# Patient Record
Sex: Male | Born: 1980 | Race: White | Hispanic: No | Marital: Single
Health system: Midwestern US, Community
[De-identification: ages and names within clinical notes are randomized; demographics above are authoritative.]

## PROBLEM LIST (undated history)

## (undated) HISTORY — PX: NOSE SURGERY: SHX723

## (undated) HISTORY — PX: FRACTURE SURGERY: SHX138

---

## 2020-05-08 ENCOUNTER — Ambulatory Visit (INDEPENDENT_AMBULATORY_CARE_PROVIDER_SITE_OTHER): Payer: 59

## 2020-05-08 ENCOUNTER — Encounter: Payer: Self-pay | Admitting: Emergency Medicine

## 2020-05-08 ENCOUNTER — Other Ambulatory Visit: Payer: Self-pay

## 2020-05-08 ENCOUNTER — Ambulatory Visit
Admission: EM | Admit: 2020-05-08 | Discharge: 2020-05-08 | Disposition: A | Discharge status: Home / Self Care | Payer: 59 | Attending: Family Medicine | Admitting: Family Medicine

## 2020-05-08 DIAGNOSIS — M5136 Other intervertebral disc degeneration, lumbar region: Secondary | ICD-10-CM

## 2020-05-08 DIAGNOSIS — W19XXXA Unspecified fall, initial encounter: Secondary | ICD-10-CM | POA: Diagnosis not present

## 2020-05-08 DIAGNOSIS — M545 Low back pain, unspecified: Secondary | ICD-10-CM

## 2020-05-08 DIAGNOSIS — M25531 Pain in right wrist: Secondary | ICD-10-CM

## 2020-05-08 NOTE — Discharge Instructions (Addendum)
Your x-rays were negative for any dislocations or fractures  Lumbar x-ray did still show moderate multilevel degenerative disc disease  You may wear wrist brace for comfort, you may also take ibuprofen or Tylenol as needed for pain  If things are not getting better follow-up with orthopedics as needed.

## 2020-05-08 NOTE — ED Provider Notes (Signed)
[facility] CARE CENTER   [id number] 05/08/20 Arrival Time: 8185  UD:JSHFW PAIN  SUBJECTIVE: History from: patient. Rodney Holt is a 39 y.o. male complains of right wrist pain, low back pain.  Reports that he tripped on the steps coming out of his apartment to go to work this morning at 4 AM.  Symptoms are made worse with activity and range of motion exercises.  Reports that he is not ever had these symptoms before.  Has not taken any OTC medications for this.  Denies fever, chills, erythema, ecchymosis, effusion, weakness, numbness and tingling, saddle paresthesias, loss of bowel or bladder function.      ROS: As per HPI.  All other pertinent ROS negative.     History reviewed. No pertinent past medical history. Past Surgical History:  Procedure Laterality Date  . FRACTURE SURGERY    . NOSE SURGERY     No Known Allergies No current facility-administered medications on file prior to encounter.   No current outpatient medications on file prior to encounter.   Social History   Socioeconomic History  . Marital status: Single    Spouse name: Not on file  . Number of children: Not on file  . Years of education: Not on file  . Highest education level: Not on file  Occupational History  . Not on file  Tobacco Use  . Smoking status: Never Smoker  . Smokeless tobacco: Never Used  Vaping Use  . Vaping Use: Never used  Substance and Sexual Activity  . Alcohol use: Yes  . Drug use: Never  . Sexual activity: Not on file  Other Topics Concern  . Not on file  Social History Narrative  . Not on file   Social Determinants of Health   Financial Resource Strain:   . Difficulty of Paying Living Expenses:   Food Insecurity:   . Worried About Programme researcher, broadcasting/film/video in the Last Year:   . Barista in the Last Year:   Transportation Needs:   . Freight forwarder (Medical):   Marland Kitchen Lack of Transportation (Non-Medical):   Physical Activity:   . Days of Exercise per Week:   . Minutes  of Exercise per Session:   Stress:   . Feeling of Stress :   Social Connections:   . Frequency of Communication with Friends and Family:   . Frequency of Social Gatherings with Friends and Family:   . Attends Religious Services:   . Active Member of Clubs or Organizations:   . Attends Banker Meetings:   Marland Kitchen Marital Status:   Intimate Partner Violence:   . Fear of Current or Ex-Partner:   . Emotionally Abused:   Marland Kitchen Physically Abused:   . Sexually Abused:    Family History  Problem Relation Age of Onset  . Healthy Mother   . Cancer Father     OBJECTIVE:  Vitals:   05/08/20 0913 05/08/20 0916  BP:  130/88  Pulse:  76  Resp:  16  Temp:  98 F (36.7 C)  TempSrc:  Oral  SpO2:  100%  Weight: 256 lb (116.1 kg)   Height: 5\' 7"  (1.702 m)     General appearance: ALERT; in no acute distress.  Head: NCAT Lungs: Normal respiratory effort CV: Radial and pedal pulses 2+ bilaterally. Cap refill < 2 seconds Musculoskeletal:  Inspection: Skin warm, dry, clear and intact without obvious erythema, effusion, or ecchymosis.  Palpation: Ulnar styloid to right wrist tender to palpation, midline low  back tenderness on exam ROM: Limited ROM active and passive to right wrist Skin: warm and dry Neurologic: Ambulates without difficulty; Sensation intact about the upper/ lower extremities Psychological: alert and cooperative; normal mood and affect  DIAGNOSTIC STUDIES:  DG Lumbar Spine Complete  Result Date: 05/08/2020 CLINICAL DATA:  Low back pain after fall today. EXAM: LUMBAR SPINE - COMPLETE 4+ VIEW COMPARISON:  None. FINDINGS: No fracture or spondylolisthesis is noted. Moderate degenerative disc disease is noted at L4-5 and L5-S1. IMPRESSION: Moderate multilevel degenerative disc disease. No acute abnormality seen in the lumbar spine. Electronically Signed   By: Lupita Raider M.D.   On: 05/08/2020 10:25   DG Wrist Complete Right  Result Date: 05/08/2020 CLINICAL DATA:   39 year old male status post fall down stairs with ulnar styloid pain. EXAM: RIGHT WRIST - COMPLETE 3+ VIEW COMPARISON:  None. FINDINGS: Bone mineralization is within normal limits. The distal ulna appears intact. There is no evidence of fracture or dislocation. There is no evidence of arthropathy or other focal bone abnormality. No discrete soft tissue injury. IMPRESSION: Negative. Electronically Signed   By: Odessa Fleming M.D.   On: 05/08/2020 10:25     ASSESSMENT & PLAN:  1. Acute midline low back pain without sciatica   2. Right wrist pain   3. Fall, initial encounter   4. Degenerative disc disease, lumbar     Continue conservative management of rest, ice, and gentle stretches Take ibuprofen or tylenol as needed Follow up with PCP if symptoms persist Return or go to the ER if you have any new or worsening symptoms (fever, chills, chest pain, abdominal pain, changes in bowel or bladder habits, pain radiating into lower legs)   Reviewed expectations re: course of current medical issues. Questions answered. Outlined signs and symptoms indicating need for more acute intervention. Patient verbalized understanding. After Visit Summary given.       Moshe Cipro, NP 05/08/20 1045

## 2020-05-08 NOTE — ED Triage Notes (Signed)
Patient states that he was leaving his apartment this morning and slipped on the steps and fell on his buttocks and lower back.  Patient c/o right wrist, lower back and tailbone pain.

## 2020-05-23 ENCOUNTER — Emergency Department
Admission: EM | Admit: 2020-05-23 | Discharge: 2020-05-23 | Disposition: A | Discharge status: Home / Self Care | Payer: 59 | Attending: Emergency Medicine | Admitting: Emergency Medicine

## 2020-05-23 ENCOUNTER — Other Ambulatory Visit: Payer: Self-pay

## 2020-05-23 ENCOUNTER — Encounter: Payer: Self-pay | Admitting: Emergency Medicine

## 2020-05-23 DIAGNOSIS — M5431 Sciatica, right side: Secondary | ICD-10-CM

## 2020-05-23 DIAGNOSIS — Y929 Unspecified place or not applicable: Secondary | ICD-10-CM | POA: Insufficient documentation

## 2020-05-23 DIAGNOSIS — Y999 Unspecified external cause status: Secondary | ICD-10-CM | POA: Diagnosis not present

## 2020-05-23 DIAGNOSIS — W19XXXA Unspecified fall, initial encounter: Secondary | ICD-10-CM | POA: Insufficient documentation

## 2020-05-23 DIAGNOSIS — Y939 Activity, unspecified: Secondary | ICD-10-CM | POA: Diagnosis not present

## 2020-05-23 DIAGNOSIS — M549 Dorsalgia, unspecified: Secondary | ICD-10-CM | POA: Diagnosis present

## 2020-05-23 MED ORDER — METHYLPREDNISOLONE 4 MG PO TBPK
ORAL_TABLET | ORAL | 0 refills | Status: DC
Start: 2020-05-23 — End: 2021-05-08

## 2020-05-23 MED ORDER — CYCLOBENZAPRINE HCL 10 MG PO TABS: 10.0000 mg | ORAL_TABLET | Freq: Three times a day (TID) | ORAL | 0 refills | Status: DC | PRN

## 2020-05-23 MED ORDER — METHYLPREDNISOLONE SODIUM SUCC 125 MG IJ SOLR
125.0000 mg | Freq: Once | INTRAMUSCULAR | Status: AC
  Administered 2020-05-23: 125 mg via INTRAMUSCULAR
  Filled 2020-05-23: qty 2

## 2020-05-23 MED ORDER — CYCLOBENZAPRINE HCL 10 MG PO TABS
10.0000 mg | ORAL_TABLET | Freq: Once | ORAL | Status: AC
  Administered 2020-05-23: 10 mg via ORAL
  Filled 2020-05-23: qty 1

## 2020-05-23 MED ORDER — TRAMADOL HCL 50 MG PO TABS: 50.0000 mg | ORAL_TABLET | Freq: Two times a day (BID) | ORAL | 0 refills | Status: DC | PRN

## 2020-05-23 NOTE — ED Notes (Signed)
See triage note  Presents s/p fall  States he fell about 1 week ago  Missed the last step  Is having pain to right lower back and into right leg  Ambulates well

## 2020-05-23 NOTE — ED Provider Notes (Signed)
[facility] Emergency Department Provider Note   ____________________________________________   First MD Initiated Contact with Patient 05/23/20 [phone]     (approximate)  I have reviewed the triage vital signs and the nursing notes.   HISTORY  Chief Complaint Back Pain    HPI Rodney Holt is a 39 y.o. male patient presents with radicular back pain to the right lower extremity.  Patient denies bladder or bowel dysfunction.  Patient state complaint started last week.  Patient states fell 2 weeks ago and has had intermittent back pain but the radicular component is new.         History reviewed. No pertinent past medical history.  There are no problems to display for this patient.   Past Surgical History:  Procedure Laterality Date  . FRACTURE SURGERY    . NOSE SURGERY      Prior to Admission medications   Medication Sig Start Date End Date Taking? Authorizing Provider  cyclobenzaprine (FLEXERIL) 10 MG tablet Take 1 tablet (10 mg total) by mouth 3 (three) times daily as needed. 05/23/20   Joni Reining, PA-C  methylPREDNISolone (MEDROL DOSEPAK) 4 MG TBPK tablet Take Tapered dose as directed starting tomorrow morning 05/23/20   Joni Reining, PA-C  traMADol (ULTRAM) 50 MG tablet Take 1 tablet (50 mg total) by mouth every 12 (twelve) hours as needed. 05/23/20   Joni Reining, PA-C    Allergies Patient has no known allergies.  Family History  Problem Relation Age of Onset  . Healthy Mother   . Cancer Father     Social History Social History   Tobacco Use  . Smoking status: Never Smoker  . Smokeless tobacco: Never Used  Vaping Use  . Vaping Use: Never used  Substance Use Topics  . Alcohol use: Yes  . Drug use: Never    Review of Systems Constitutional: No fever/chills Eyes: No visual changes. ENT: No sore throat. Cardiovascular: Denies chest pain. Respiratory: Denies shortness of breath. Gastrointestinal: No abdominal pain.  No  nausea, no vomiting.  No diarrhea.  No constipation. Genitourinary: Negative for dysuria. Musculoskeletal: Positive for back pain. Skin: Negative for rash. Neurological: Intermitting numbness sensation to the right lower extremity.   ____________________________________________   PHYSICAL EXAM:  VITAL SIGNS: ED Triage Vitals  Enc Vitals Group     BP 05/23/20 0332 (!) 182/93     Pulse Rate 05/23/20 0332 95     Resp 05/23/20 0332 18     Temp 05/23/20 0332 98.6 F (37 C)     Temp Source 05/23/20 0332 Oral     SpO2 05/23/20 0332 98 %     Weight 05/23/20 0715 255 lb 15.3 oz (116.1 kg)     Height 05/23/20 0715 5\' 7"  (1.702 m)     Head Circumference --      Peak Flow --      Pain Score --      Pain Loc --      Pain Edu? --      Excl. in GC? --    Constitutional: Alert and oriented. Well appearing and in no acute distress.  BMI is 40.09. Cardiovascular: Normal rate, regular rhythm. Grossly normal heart sounds.  Good peripheral circulation. Respiratory: Normal respiratory effort.  No retractions. Lungs CTAB. Musculoskeletal: No obvious spinal deformity.  Patient has moderate guarding palpation L4-S1.  No lower extremity tenderness nor edema.   Neurologic:  Normal speech and language. No gross focal neurologic deficits are appreciated. No  gait instability.  Patient had negative straight leg test in the supine position. Skin:  Skin is warm, dry and intact. No rash noted. Psychiatric: Mood and affect are normal. Speech and behavior are normal.  ____________________________________________   LABS (all labs ordered are listed, but only abnormal results are displayed)  Labs Reviewed - No data to display ____________________________________________  EKG   ____________________________________________  RADIOLOGY  ED MD interpretation:    Official radiology report(s): No results found.  ____________________________________________   PROCEDURES  Procedure(s) performed  (including Critical Care):  Procedures   ____________________________________________   INITIAL IMPRESSION / ASSESSMENT AND PLAN / ED COURSE  As part of my medical decision making, I reviewed the following data within the electronic MEDICAL RECORD NUMBER     Patient presents for radicular complaints to the right lower extremity status post fall 2 weeks ago.  Patient complaint physical exam consistent with sciatica.  Patient given discharge care instruction advised take medication as directed.  Patient advised [facility] care with the [facility].    Rodney Holt was evaluated in Emergency Department on 05/23/2020 for the symptoms described in the history of present illness. He was evaluated in the context of the global COVID-19 pandemic, which necessitated consideration that the patient might be at risk for infection with the SARS-CoV-2 virus that causes COVID-19. Institutional protocols and algorithms that pertain to the evaluation of patients at risk for COVID-19 are in a state of rapid change based on information released by regulatory bodies including the CDC and federal and state organizations. These policies and algorithms were followed during the patient's care in the ED.       ____________________________________________   FINAL CLINICAL IMPRESSION(S) / ED DIAGNOSES  Final diagnoses:  Sciatica of right side     ED Discharge Orders         Ordered    methylPREDNISolone (MEDROL DOSEPAK) 4 MG TBPK tablet     Discontinue  Reprint     05/23/20 0733    cyclobenzaprine (FLEXERIL) 10 MG tablet  3 times daily PRN     Discontinue  Reprint     05/23/20 0733    traMADol (ULTRAM) 50 MG tablet  Every 12 hours PRN     Discontinue  Reprint     05/23/20 7253           Note:  This document was prepared using Dragon voice recognition software and may include unintentional dictation errors.    Joni Reining, PA-C 05/23/20 6644    Minna Antis, MD 05/23/20 1436

## 2020-05-23 NOTE — Discharge Instructions (Signed)
Follow discharge care instruction take medication as directed. °

## 2020-05-23 NOTE — ED Triage Notes (Signed)
Pt reports falling x1 week ago and since has had pain in the lower right back that radiates down into the right leg.

## 2020-05-26 ENCOUNTER — Telehealth: Payer: Self-pay | Admitting: General Practice

## 2020-05-26 NOTE — Telephone Encounter (Signed)
Individual has health insurance and is ineligible to become a pt in the clinic. °

## 2020-11-19 ENCOUNTER — Other Ambulatory Visit: Payer: Self-pay

## 2020-11-19 ENCOUNTER — Emergency Department
Admission: EM | Admit: 2020-11-19 | Discharge: 2020-11-19 | Disposition: A | Discharge status: Home / Self Care | Payer: 59 | Attending: Emergency Medicine | Admitting: Emergency Medicine

## 2020-11-19 DIAGNOSIS — Z5321 Procedure and treatment not carried out due to patient leaving prior to being seen by health care provider: Secondary | ICD-10-CM | POA: Diagnosis not present

## 2020-11-19 DIAGNOSIS — R079 Chest pain, unspecified: Secondary | ICD-10-CM | POA: Insufficient documentation

## 2020-11-19 DIAGNOSIS — R6884 Jaw pain: Secondary | ICD-10-CM | POA: Diagnosis not present

## 2020-11-19 LAB — CBC
HCT: 47.6 % (ref 39.0–52.0)
Hemoglobin: 16.4 g/dL (ref 13.0–17.0)
MCH: 29.4 pg (ref 26.0–34.0)
MCHC: 34.5 g/dL (ref 30.0–36.0)
MCV: 85.5 fL (ref 80.0–100.0)
Platelets: 242 10*3/uL (ref 150–400)
RBC: 5.57 MIL/uL (ref 4.22–5.81)
RDW: 12.7 % (ref 11.5–15.5)
WBC: 8.7 10*3/uL (ref 4.0–10.5)
nRBC: 0 % (ref 0.0–0.2)

## 2020-11-19 LAB — BASIC METABOLIC PANEL
Anion gap: 11 (ref 5–15)
BUN: 14 mg/dL (ref 6–20)
CO2: 24 mmol/L (ref 22–32)
Calcium: 8.8 mg/dL — ABNORMAL LOW (ref 8.9–10.3)
Chloride: 106 mmol/L (ref 98–111)
Creatinine, Ser: 0.91 mg/dL (ref 0.61–1.24)
GFR, Estimated: >60 mL/min (ref >60)
Glucose, Bld: 94 mg/dL (ref 70–99)
Potassium: 4.2 mmol/L (ref 3.5–5.1)
Sodium: 141 mmol/L (ref 135–145)

## 2020-11-19 LAB — TROPONIN I (HIGH SENSITIVITY): Troponin I (High Sensitivity): <2 ng/L (ref <18)

## 2020-11-19 NOTE — ED Triage Notes (Signed)
Pt arrives via pov, ambulatory to triage. Pt c/o CP starting yesterday, radiating to left jaw. Pt denies any n/v/d, SOB. NAD noted at this time

## 2020-11-19 NOTE — ED Notes (Signed)
Pt called to be roomed x2.  No answer.

## 2020-11-19 NOTE — ED Notes (Signed)
Pt LWBS after triage.  Pt did not inform [employer] Char[employer] fundraiser.

## 2020-11-19 NOTE — ED Notes (Signed)
Pt called to be roomed x1.  No answer by patient.

## 2020-11-19 NOTE — ED Notes (Signed)
Pt called to be roomed x3.  No answer.

## 2021-04-17 ENCOUNTER — Ambulatory Visit (INDEPENDENT_AMBULATORY_CARE_PROVIDER_SITE_OTHER): Payer: Worker's Compensation

## 2021-04-17 ENCOUNTER — Other Ambulatory Visit: Payer: Self-pay

## 2021-04-17 ENCOUNTER — Encounter: Payer: Self-pay | Admitting: Emergency Medicine

## 2021-04-17 ENCOUNTER — Ambulatory Visit
Admission: EM | Admit: 2021-04-17 | Discharge: 2021-04-17 | Disposition: A | Discharge status: Home / Self Care | Payer: Worker's Compensation | Attending: Physician Assistant | Admitting: Physician Assistant

## 2021-04-17 DIAGNOSIS — M25562 Pain in left knee: Secondary | ICD-10-CM

## 2021-04-17 DIAGNOSIS — W133XXA Fall through floor, initial encounter: Secondary | ICD-10-CM

## 2021-04-17 DIAGNOSIS — S46212A Strain of muscle, fascia and tendon of other parts of biceps, left arm, initial encounter: Secondary | ICD-10-CM

## 2021-04-17 DIAGNOSIS — M25561 Pain in right knee: Secondary | ICD-10-CM

## 2021-04-17 DIAGNOSIS — M25522 Pain in left elbow: Secondary | ICD-10-CM

## 2021-04-17 MED ORDER — DICLOFENAC SODIUM 75 MG PO TBEC: 75.0000 mg | DELAYED_RELEASE_TABLET | Freq: Two times a day (BID) | ORAL | 0 refills | Status: AC

## 2021-04-17 NOTE — ED Triage Notes (Signed)
Patient states that he fell through the floor at work around 8:00 this morning.  Patient c/o left elbow pain, right knee pain and some left knee pain.  Patient denies hitting his head.

## 2021-04-17 NOTE — ED Provider Notes (Signed)
[facility]    CSN: [id number] Arrival date & time: 04/17/21  1008      History   Chief Complaint Chief Complaint  Patient presents with   Fall   Knee Pain    right   Elbow Pain    left   Worker's Comp Injury    HPI Rodney Holt is a 40 y.o. male presenting for multiple work-related injuries.  Patient states that at 8 AM this morning he was at work and fell through a great in the ground.  He says that he fell down about 5 to 6 feet.  He says that he fell onto both of his knees and believes that he twisted his left arm/elbow.  He has had some swelling of both of his knees and pain.  Admits to mild discomfort when walking but is able to.  Patient says that he has most of his pain in his biceps region on the left side and his left elbow.  Increased pain whenever he flexes his elbow.  No numbness, weakness or tingling.  He denies any head injury or loss of consciousness in the fall.  So far he has not taken anything for pain relief.  He denies any other complaints.  HPI  History reviewed. No pertinent past medical history.  There are no problems to display for this patient.   Past Surgical History:  Procedure Laterality Date   FRACTURE SURGERY     NOSE SURGERY         Home Medications    Prior to Admission medications   Medication Sig Start Date End Date Taking? Authorizing Provider  diclofenac (VOLTAREN) 75 MG EC tablet Take 1 tablet (75 mg total) by mouth 2 (two) times daily for 10 days. 04/17/21 04/27/21 Yes Shirlee Latch, PA-C  cyclobenzaprine (FLEXERIL) 10 MG tablet Take 1 tablet (10 mg total) by mouth 3 (three) times daily as needed. 05/23/20   Joni Reining, PA-C  methylPREDNISolone (MEDROL DOSEPAK) 4 MG TBPK tablet Take Tapered dose as directed starting tomorrow morning 05/23/20   Joni Reining, PA-C  traMADol (ULTRAM) 50 MG tablet Take 1 tablet (50 mg total) by mouth every 12 (twelve) hours as needed. 05/23/20   Joni Reining, PA-C    Family  History Family History  Problem Relation Age of Onset   Healthy Mother    Cancer Father     Social History Social History   Tobacco Use   Smoking status: Never   Smokeless tobacco: Never  Vaping Use   Vaping Use: Never used  Substance Use Topics   Alcohol use: Yes   Drug use: Never     Allergies   Patient has no known allergies.   Review of Systems Review of Systems  Respiratory:  Negative for shortness of breath.   Cardiovascular:  Negative for chest pain.  Musculoskeletal:  Positive for arthralgias and joint swelling. Negative for gait problem.  Skin:  Negative for color change and wound.  Neurological:  Negative for dizziness, weakness and headaches.  Hematological:  Does not bruise/bleed easily.    Physical Exam Triage Vital Signs ED Triage Vitals  Enc Vitals Group     BP 04/17/21 1056 121/90     Pulse Rate 04/17/21 1056 63     Resp 04/17/21 1056 16     Temp 04/17/21 1056 98.6 F (37 C)     Temp Source 04/17/21 1056 Oral     SpO2 04/17/21 1056 96 %  Weight 04/17/21 1053 260 lb (117.9 kg)     Height 04/17/21 1053 5\' 7"  (1.702 m)     Head Circumference --      Peak Flow --      Pain Score 04/17/21 1053 8     Pain Loc --      Pain Edu? --      Excl. in GC? --    No data found.  Updated Vital Signs BP 121/90 (BP Location: Right Arm)   Pulse 63   Temp 98.6 F (37 C) (Oral)   Resp 16   Ht 5\' 7"  (1.702 m)   Wt 260 lb (117.9 kg)   SpO2 96%   BMI 40.72 kg/m       Physical Exam Vitals and nursing note reviewed.  Constitutional:      General: He is not in acute distress.    Appearance: Normal appearance. He is well-developed. He is not ill-appearing.  HENT:     Head: Normocephalic and atraumatic.  Eyes:     General: No scleral icterus.    Conjunctiva/sclera: Conjunctivae normal.     Pupils: Pupils are equal, round, and reactive to light.  Cardiovascular:     Rate and Rhythm: Normal rate and regular rhythm.     Heart sounds: Normal heart  sounds.  Pulmonary:     Effort: Pulmonary effort is normal. No respiratory distress.     Breath sounds: Normal breath sounds.  Musculoskeletal:     Cervical back: Neck supple.     Comments: Left knee: Mild swelling of anterior medial knee.  Tenderness of the patella and the medial joint line.  Full range of motion of knee but does have some discomfort with full extension and flexion.  No ecchymosis or lacerations.  Good strength and sensation.  Right knee: No swelling, ecchymosis, abrasions or lacerations.  Moderate tenderness of the patella and the lateral joint line.  Full range of motion of knee but does have discomfort with full extension and flexion.  Good strength and sensation throughout.  Left arm: There is a small contusion and some swelling over the lateral elbow.  Tenderness to palpation over the olecranon, medial and lateral epicondyles and along the biceps tendon.  Biceps tendon is intact.  Full range of motion of elbow but does have discomfort with full extension and flexion.  Skin:    General: Skin is warm and dry.  Neurological:     General: No focal deficit present.     Mental Status: He is alert. Mental status is at baseline.     Motor: No weakness.     Gait: Gait abnormal (mildly antalgic).  Psychiatric:        Mood and Affect: Mood normal.        Behavior: Behavior normal.        Thought Content: Thought content normal.     UC Treatments / Results  Labs (all labs ordered are listed, but only abnormal results are displayed) Labs Reviewed - No data to display  EKG   Radiology DG Elbow Complete Left  Result Date: 04/17/2021 CLINICAL DATA:  Larey SeatFell.  Left elbow pain. EXAM: LEFT ELBOW - COMPLETE 3+ VIEW COMPARISON:  None. FINDINGS: The joint spaces are maintained. No acute fracture. No osteochondral lesion. No joint effusion. IMPRESSION: No acute bony findings. Electronically Signed   By: Rudie MeyerP.  Gallerani M.D.   On: 04/17/2021 11:54   DG Knee Complete 4 Views  Left  Result Date: 04/17/2021 CLINICAL DATA:  Larey SeatFell.  Left knee pain. EXAM: LEFT KNEE - COMPLETE 4+ VIEW COMPARISON:  None. FINDINGS: Thecal oint spaces are maintained. No acute fracture. No osteochondral lesion. No joint effusion. IMPRESSION: No acute bony findings or joint effusion. Electronically Signed   By: Rudie Meyer M.D.   On: 04/17/2021 11:53   DG Knee Complete 4 Views Right  Result Date: 04/17/2021 CLINICAL DATA:  Larey Seat through the floor at work today. Right knee pain. EXAM: RIGHT KNEE - COMPLETE 4+ VIEW COMPARISON:  None. FINDINGS: The joint spaces are maintained. No acute fracture is identified. No osteochondral lesion. No joint effusion. IMPRESSION: No acute bony findings or joint effusion. Electronically Signed   By: Rudie Meyer M.D.   On: 04/17/2021 11:52    Procedures Procedures (including critical care time)  Medications Ordered in UC Medications - No data to display  Initial Impression / Assessment and Plan / UC Course  I have reviewed the triage vital signs and the nursing notes.  Pertinent labs & imaging results that were available during my care of the patient were reviewed by me and considered in my medical decision making (see chart for details).  40 year old male presenting for multiple work-related injuries that occurred today.  Patient complaining of right and left knee pain as well as left elbow and biceps pain.  X-rays of bilateral knees and elbow obtained today.  X-rays independently viewed by me.  X-rays were read as normal.  No acute bony abnormality or joint effusions noted.  Reviewed this with patient.  Advised him that he may develop some contusions of his knees due to the direct impact he denies any severe twisting injury and he has good range of motion of knees so I am not concerned about a ligamentous tear.  He has full range of motion of his elbow but he does have some tenderness along the biceps tendon.  Tenderness fully intact.  Supportive care advised.   Advise no lifting until feeling better.  Sent in diclofenac and also advised RICE guidelines and Tylenol as needed for pain relief.  Patient is supposed to work tomorrow so I gave him a note to be out.  He is not supposed to return to work on Wednesday.  Advised patient that he can return to work on Wednesday as long as he is feeling better but if he is not he needs to contact [facility] employee health for appointment in case he needs further work restrictions.  Patient agreeable.   Final Clinical Impressions(s) / UC Diagnoses   Final diagnoses:  Strain of left biceps, initial encounter  Acute pain of right knee  Acute pain of left knee     Discharge Instructions      KNEE PAIN and BICEPS INJURY:  Stressed avoiding painful activities . Reviewed RICE guidelines. Use medications as directed, including NSAIDs. If no NSAIDs have been prescribed for you today, you may take Aleve or Motrin over the counter. May use Tylenol in between doses of NSAIDs.  If no improvement in the next 1-2 weeks, f/u with PCP or return to our office for reexamination, and please feel free to call or return at any time for any questions or concerns you may have and we will be happy to help you!      I have given you a note to be out of work tomorrow.  If you believe you cannot return to full job duty on 04/21/2021, he should follow-up with [facility] health employee health and wellness center at [facility].  Address is [address] [place], [place].  Call to make appointment.  [phone].  Clinic hours are from 8-5 Monday through Friday.     ED Prescriptions     Medication Sig Dispense Auth. Provider   diclofenac (VOLTAREN) 75 MG EC tablet Take 1 tablet (75 mg total) by mouth 2 (two) times daily for 10 days. 20 tablet Gareth Morgan      PDMP not reviewed this encounter.   Shirlee Latch, PA-C 04/17/21 1215

## 2021-04-17 NOTE — Discharge Instructions (Addendum)
KNEE PAIN and BICEPS INJURY:  Stressed avoiding painful activities . Reviewed RICE guidelines. Use medications as directed, including NSAIDs. If no NSAIDs have been prescribed for you today, you may take Aleve or Motrin over the counter. May use Tylenol in between doses of NSAIDs.  If no improvement in the next 1-2 weeks, f/u with PCP or return to our office for reexamination, and please feel free to call or return at any time for any questions or concerns you may have and we will be happy to help you!      I have given you a note to be out of work tomorrow.  If you believe you cannot return to full job duty on 04/21/2021, he should follow-up with [facility] health employee health and wellness center at [facility].  Address is [address] [place], [place].  Call to make appointment.  [phone].  Clinic hours are from 8-5 Monday through Friday.

## 2021-05-08 ENCOUNTER — Ambulatory Visit
Admission: EM | Admit: 2021-05-08 | Discharge: 2021-05-08 | Disposition: A | Discharge status: Home / Self Care | Payer: 59 | Attending: Family Medicine | Admitting: Family Medicine

## 2021-05-08 ENCOUNTER — Encounter: Payer: Self-pay | Admitting: Emergency Medicine

## 2021-05-08 ENCOUNTER — Other Ambulatory Visit: Payer: Self-pay

## 2021-05-08 DIAGNOSIS — G4489 Other headache syndrome: Secondary | ICD-10-CM | POA: Diagnosis not present

## 2021-05-08 DIAGNOSIS — J019 Acute sinusitis, unspecified: Secondary | ICD-10-CM | POA: Diagnosis not present

## 2021-05-08 MED ORDER — PREDNISONE 10 MG (21) PO TBPK: ORAL_TABLET | ORAL | 0 refills | Status: AC

## 2021-05-08 NOTE — Discharge Instructions (Addendum)
Continue the antibiotic and antihistamine.  Steroid as prescribed. This should improve your headache. If not, please go to the ER.  Take care  Dr. Adriana Simas

## 2021-05-08 NOTE — ED Triage Notes (Signed)
Headache for 2 weeks.  Reports he did start medicine prescribed yesterday, "but no relief".  Patient reports pain in face, behind eyes and right side of head.  States bones around eyes are super sensitive. Stuffy nose, no cough

## 2021-05-08 NOTE — ED Provider Notes (Signed)
[facility]    CSN: [id number] Arrival date & time: 05/08/21  1047      History   Chief Complaint Chief Complaint  Patient presents with  . Headache    HPI  40 year old male presents with headache.  Patient reports that he has had a headache for 2 weeks.  He states that is located behind his eyes and around the maxillary region.  He states that he has some congestion.  No fever.  No runny nose.  He had a telemedicine visit yesterday and was placed on Augmentin for sinusitis.  Also advised to take a daily antihistamine.  Patient states that he has taken the medication and has had no resolution in his symptoms.  Patient states that he is having difficulty with a headache.  He states that it is interfering with his sleep and also is making it difficult to focus at work.  He has taken BC powder and Excedrin without relief.  No fever.  No past medical history of migraine or other headache syndromes.  No reports of vision changes.  No neurological symptoms.  No other complaints.  Home Medications    Prior to Admission medications   Medication Sig Start Date End Date Taking? Authorizing Provider  loratadine (CLARITIN) 10 MG tablet Take 10 mg by mouth daily.   Yes [provider]  predniSONE (STERAPRED UNI-PAK 21 TAB) 10 MG (21) TBPK tablet 6 tablets on day 1; decrease by 1 tablet daily until gone. 05/08/21  Yes Aleayah Chico G, DO  amoxicillin (AMOXIL) 875 MG tablet Take 875 mg by mouth 2 (two) times daily. 05/07/21   [provider]    Family History Family History  Problem Relation Age of Onset  . Healthy Mother   . Cancer Father     Social History Social History   Tobacco Use  . Smoking status: Never  . Smokeless tobacco: Never  Vaping Use  . Vaping Use: Never used  Substance Use Topics  . Alcohol use: Yes  . Drug use: Never     Allergies   Codeine   Review of Systems Review of Systems  Constitutional: Negative.   HENT:  Positive for  congestion.   Neurological:  Positive for headaches.    Physical Exam Triage Vital Signs ED Triage Vitals  Enc Vitals Group     BP 05/08/21 1100 119/77     Pulse Rate 05/08/21 1100 71     Resp 05/08/21 1100 18     Temp 05/08/21 1100 98.4 F (36.9 C)     Temp Source 05/08/21 1100 Oral     SpO2 05/08/21 1100 97 %     Weight --      Height --      Head Circumference --      Peak Flow --      Pain Score 05/08/21 1055 9     Pain Loc --      Pain Edu? --      Excl. in GC? --    Updated Vital Signs BP 119/77 (BP Location: Right Arm) Comment (BP Location): large cuff  Pulse 71   Temp 98.4 F (36.9 C) (Oral)   Resp 18   SpO2 97%   Visual Acuity Right Eye Distance:   Left Eye Distance:   Bilateral Distance:    Right Eye Near:   Left Eye Near:    Bilateral Near:     Physical Exam Constitutional:      General: He is not  in acute distress.    Appearance: Normal appearance. He is obese. He is not ill-appearing.  HENT:     Head: Normocephalic and atraumatic.     Right Ear: Tympanic membrane normal.     Left Ear: Tympanic membrane normal.     Mouth/Throat:     Pharynx: Posterior oropharyngeal erythema present. No oropharyngeal exudate.  Eyes:     General:        Right eye: No discharge.        Left eye: No discharge.     Conjunctiva/sclera: Conjunctivae normal.  Cardiovascular:     Rate and Rhythm: Normal rate and regular rhythm.  Pulmonary:     Effort: Pulmonary effort is normal.     Breath sounds: Normal breath sounds. No wheezing or rales.  Neurological:     General: No focal deficit present.     Mental Status: He is alert.  Psychiatric:        Mood and Affect: Mood normal.        Behavior: Behavior normal.     UC Treatments / Results  Labs (all labs ordered are listed, but only abnormal results are displayed) Labs Reviewed - No data to display  EKG   Radiology No results found.  Procedures Procedures (including critical care time)  Medications  Ordered in UC Medications - No data to display  Initial Impression / Assessment and Plan / UC Course  I have reviewed the triage vital signs and the nursing notes.  Pertinent labs & imaging results that were available during my care of the patient were reviewed by me and considered in my medical decision making (see chart for details).    40 year old male presents with persistent headache.  Given his history, I suspect that this is either tension or it is from sinusitis which she was recently diagnosed with.  Continue Augmentin.  Placing on prednisone.  Final Clinical Impressions(s) / UC Diagnoses   Final diagnoses:  Acute sinusitis, recurrence not specified, unspecified location  Other headache syndrome     Discharge Instructions      Continue the antibiotic and antihistamine.  Steroid as prescribed. This should improve your headache. If not, please go to the ER.  Take care  Dr. Adriana Simas   ED Prescriptions     Medication Sig Dispense Auth. Provider   predniSONE (STERAPRED UNI-PAK 21 TAB) 10 MG (21) TBPK tablet 6 tablets on day 1; decrease by 1 tablet daily until gone. 21 tablet Everlene Other G, DO      PDMP not reviewed this encounter.   Tommie Sams, [place] 05/08/21 1204

## 2021-05-11 ENCOUNTER — Other Ambulatory Visit: Payer: Self-pay

## 2021-05-11 ENCOUNTER — Encounter: Payer: Self-pay | Admitting: *Deleted

## 2021-05-11 DIAGNOSIS — R220 Localized swelling, mass and lump, head: Secondary | ICD-10-CM | POA: Diagnosis not present

## 2021-05-11 DIAGNOSIS — R110 Nausea: Secondary | ICD-10-CM | POA: Insufficient documentation

## 2021-05-11 DIAGNOSIS — R519 Headache, unspecified: Secondary | ICD-10-CM | POA: Insufficient documentation

## 2021-05-11 DIAGNOSIS — H53149 Visual discomfort, unspecified: Secondary | ICD-10-CM | POA: Diagnosis not present

## 2021-05-11 DIAGNOSIS — Z20822 Contact with and (suspected) exposure to covid-19: Secondary | ICD-10-CM | POA: Diagnosis not present

## 2021-05-11 NOTE — ED Triage Notes (Signed)
Pt arrives via [facility] from home with reports by the medic that pt has had headache for several weeks. Seen at [facility] for the same, given meds for sinus infections. Headache is keeping him up at night. En route 124/83, hr 69, saturation 95%, nkda, no other PMH

## 2021-05-11 NOTE — ED Triage Notes (Signed)
Pt reports generalized headache, but mainly around the eyes. Nausea. Taking UC meds as prescribed.

## 2021-05-12 ENCOUNTER — Emergency Department
Admission: EM | Admit: 2021-05-12 | Discharge: 2021-05-12 | Payer: 59 | Attending: Emergency Medicine | Admitting: Emergency Medicine

## 2021-05-12 ENCOUNTER — Emergency Department: Payer: 59

## 2021-05-12 DIAGNOSIS — R900 Intracranial space-occupying lesion found on diagnostic imaging of central nervous system: Secondary | ICD-10-CM

## 2021-05-12 DIAGNOSIS — R519 Headache, unspecified: Secondary | ICD-10-CM

## 2021-05-12 LAB — CBC WITH DIFFERENTIAL/PLATELET
Abs Immature Granulocytes: 0.04 10*3/uL (ref 0.00–0.07)
Basophils Absolute: 0 10*3/uL (ref 0.0–0.1)
Basophils Relative: 0 %
Eosinophils Absolute: 0 10*3/uL (ref 0.0–0.5)
Eosinophils Relative: 0 %
HCT: 46.7 % (ref 39.0–52.0)
Hemoglobin: 16 g/dL (ref 13.0–17.0)
Immature Granulocytes: 0 %
Lymphocytes Relative: 10 %
Lymphs Abs: 1 10*3/uL (ref 0.7–4.0)
MCH: 29.9 pg (ref 26.0–34.0)
MCHC: 34.3 g/dL (ref 30.0–36.0)
MCV: 87.1 fL (ref 80.0–100.0)
Monocytes Absolute: 0.5 10*3/uL (ref 0.1–1.0)
Monocytes Relative: 5 %
Neutro Abs: 8.7 10*3/uL — ABNORMAL HIGH (ref 1.7–7.7)
Neutrophils Relative %: 85 %
Platelets: 201 10*3/uL (ref 150–400)
RBC: 5.36 MIL/uL (ref 4.22–5.81)
RDW: 13.1 % (ref 11.5–15.5)
WBC: 10.2 10*3/uL (ref 4.0–10.5)
nRBC: 0 % (ref 0.0–0.2)

## 2021-05-12 LAB — COMPREHENSIVE METABOLIC PANEL
ALT: 23 U/L (ref 0–44)
AST: 14 U/L — ABNORMAL LOW (ref 15–41)
Albumin: 4.2 g/dL (ref 3.5–5.0)
Alkaline Phosphatase: 41 U/L (ref 38–126)
Anion gap: 7 (ref 5–15)
BUN: 19 mg/dL (ref 6–20)
CO2: 25 mmol/L (ref 22–32)
Calcium: 8.9 mg/dL (ref 8.9–10.3)
Chloride: 106 mmol/L (ref 98–111)
Creatinine, Ser: 0.94 mg/dL (ref 0.61–1.24)
GFR, Estimated: >60 mL/min (ref >60)
Glucose, Bld: 112 mg/dL — ABNORMAL HIGH (ref 70–99)
Potassium: 3.9 mmol/L (ref 3.5–5.1)
Sodium: 138 mmol/L (ref 135–145)
Total Bilirubin: 0.8 mg/dL (ref 0.3–1.2)
Total Protein: 7.7 g/dL (ref 6.5–8.1)

## 2021-05-12 LAB — PROTIME-INR
INR: 1 (ref 0.8–1.2)
Prothrombin Time: 13.5 seconds (ref 11.4–15.2)

## 2021-05-12 LAB — APTT: aPTT: 25 seconds (ref 24–36)

## 2021-05-12 LAB — RESP PANEL BY RT-PCR (FLU A&B, COVID) ARPGX2
Influenza A by PCR: NEGATIVE
Influenza B by PCR: NEGATIVE
SARS Coronavirus 2 by RT PCR: NEGATIVE

## 2021-05-12 MED ORDER — DEXAMETHASONE SODIUM PHOSPHATE 10 MG/ML IJ SOLN
10.0000 mg | Freq: Once | INTRAMUSCULAR | Status: AC
  Administered 2021-05-12: 10 mg via INTRAVENOUS
  Filled 2021-05-12: qty 1

## 2021-05-12 MED ORDER — FENTANYL CITRATE (PF) 100 MCG/2ML IJ SOLN
50.0000 ug | Freq: Once | INTRAMUSCULAR | Status: AC
  Administered 2021-05-12: 50 ug via INTRAVENOUS
  Filled 2021-05-12: qty 2

## 2021-05-12 MED ORDER — ACETAMINOPHEN 500 MG PO TABS
1000.0000 mg | ORAL_TABLET | Freq: Once | ORAL | Status: AC
  Administered 2021-05-12: 1000 mg via ORAL
  Filled 2021-05-12: qty 2

## 2021-05-12 MED ORDER — LACTATED RINGERS IV BOLUS
1000.0000 mL | Freq: Once | INTRAVENOUS | Status: AC
  Administered 2021-05-12: 1000 mL via INTRAVENOUS

## 2021-05-12 MED ORDER — KETOROLAC TROMETHAMINE 30 MG/ML IJ SOLN
15.0000 mg | Freq: Once | INTRAMUSCULAR | Status: AC
  Administered 2021-05-12: 15 mg via INTRAVENOUS
  Filled 2021-05-12: qty 1

## 2021-05-12 MED ORDER — DROPERIDOL 2.5 MG/ML IJ SOLN
2.5000 mg | Freq: Once | INTRAMUSCULAR | Status: AC
  Administered 2021-05-12: 2.5 mg via INTRAVENOUS
  Filled 2021-05-12: qty 2

## 2021-05-12 NOTE — ED Notes (Signed)
Explained to pt about upcoming transfer to [facility] ED. Gave verbal understanding and consent to RN.

## 2021-05-12 NOTE — ED Notes (Signed)
Pt accepted to [facility] ED to ED per Coord. Jasmine. Call report to [phone]. Accepting provider Pearson Grippe.

## 2021-05-12 NOTE — ED Provider Notes (Signed)
[facility] Emergency Department Provider Note ____________________________________________   Event Date/Time   First MD Initiated Contact with Patient 05/12/21 0113     (approximate)  I have reviewed the triage vital signs and the nursing notes.  HISTORY  Chief Complaint Headache   HPI Rodney Holt is a 40 y.o. malewho presents to the ED for evaluation of headache.   Chart review indicates no relevant hx.  History of obesity.  Patient otherwise reports no medical history.  Non-smoker.  He reports having a nose surgery remotely as a teenager after a broken nose, but no other procedures.  No family history of CNS malignancy.  Patient presents to the ED for evaluation of 2 weeks of a terrible headache.  He denies any inciting trauma or events.  He denies a history of headaches and reports this is new.  He reports associated nausea and photophobia.  Denies fevers or syncopal episodes.  Denies falls or trauma.  Reports using BC powders with no relief.  Reports being seen at a local urgent care 3 to 4 days ago, and was diagnosed with sinusitis, and provided steroids with no improvement of his symptoms.  History reviewed. No pertinent past medical history.  There are no problems to display for this patient.   Past Surgical History:  Procedure Laterality Date   FRACTURE SURGERY     NOSE SURGERY      Prior to Admission medications   Medication Sig Start Date End Date Taking? Authorizing Provider  amoxicillin (AMOXIL) 875 MG tablet Take 875 mg by mouth 2 (two) times daily. 05/07/21   [provider]  loratadine (CLARITIN) 10 MG tablet Take 10 mg by mouth daily.    [provider]  predniSONE (STERAPRED UNI-PAK 21 TAB) 10 MG (21) TBPK tablet 6 tablets on day 1; decrease by 1 tablet daily until gone. 05/08/21   Tommie Sams, DO    Allergies Codeine  Family History  Problem Relation Age of Onset   Healthy Mother    Cancer Father      Social History Social History   Tobacco Use   Smoking status: Never   Smokeless tobacco: Never  Vaping Use   Vaping Use: Never used  Substance Use Topics   Alcohol use: Yes   Drug use: Never    Review of Systems  Constitutional: No fever/chills Eyes: No visual changes. ENT: No sore throat. Cardiovascular: Denies chest pain. Respiratory: Denies shortness of breath. Gastrointestinal: No abdominal pain.  no vomiting.  No diarrhea.  No constipation. Positive for nausea Genitourinary: Negative for dysuria. Musculoskeletal: Negative for back pain. Skin: Negative for rash. Neurological: Negative for focal weakness or numbness.  Positive for headache with photophobia  ____________________________________________   PHYSICAL EXAM:  VITAL SIGNS: Vitals:   05/12/21 0224 05/12/21 0224  BP: 126/88 126/88  Pulse: 87 87  Resp: 20   Temp:    SpO2: 95% 95%    Constitutional: Alert and oriented.  Nearly prone on a hallway stretcher, resting on his knees and elbows with his head tucked, obviously uncomfortable and moaning with pain and photophobia.  Answers questions appropriately and follows commands in all 4 extremities. Eyes: Conjunctivae are normal. PERRL. EOMI. Head: Atraumatic. Nose: No congestion/rhinnorhea. Mouth/Throat: Mucous membranes are moist.  Oropharynx non-erythematous. Neck: No stridor. No cervical spine tenderness to palpation. Cardiovascular: Normal rate, regular rhythm. Grossly normal heart sounds.  Good peripheral circulation. Respiratory: Normal respiratory effort.  No retractions. Lungs CTAB. Gastrointestinal: Soft , nondistended, nontender to  palpation. No CVA tenderness. Musculoskeletal: No lower extremity tenderness nor edema.  No joint effusions. No signs of acute trauma. Neurologic:  Normal speech and language. No gross focal neurologic deficits are appreciated.  Skin:  Skin is warm, dry and intact. No rash noted. Psychiatric: Mood and affect are  normal. Speech and behavior are normal. ____________________________________________   LABS (all labs ordered are listed, but only abnormal results are displayed)  Labs Reviewed  CBC WITH DIFFERENTIAL/PLATELET - Abnormal; Notable for the following components:      Result Value   Neutro Abs 8.7 (*)    All other components within normal limits  RESP PANEL BY RT-PCR (FLU A&B, COVID) ARPGX2  COMPREHENSIVE METABOLIC PANEL  PROTIME-INR  APTT   ____________________________________________  12 Lead EKG   ____________________________________________  RADIOLOGY  ED MD interpretation: CT head reviewed by me with large right-sided cystic mass with leftward midline shift  Official radiology report(s): CT Head Wo Contrast  Result Date: 05/12/2021 CLINICAL DATA:  Headache EXAM: CT HEAD WITHOUT CONTRAST TECHNIQUE: Contiguous axial images were obtained from the base of the skull through the vertex without intravenous contrast. COMPARISON:  None. FINDINGS: Brain: There is a large, predominantly cystic lesion within the left hemisphere with moderate surrounding vasogenic edema. There is 13 mm of leftward midline shift with herniation of the right cingulate gyrus below the falx cerebri. No acute hemorrhage. 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 Vascular: No abnormal hyperdensity of the major intracranial arteries or dural venous sinuses. No intracranial atherosclerosis. Skull: The visualized skull base, calvarium and extracranial soft tissues are normal. Sinuses/Orbits: No fluid levels or advanced mucosal thickening of the visualized paranasal sinuses. No mastoid or middle ear effusion. The orbits are normal. IMPRESSION: 1. Large, predominantly cystic lesion within the left hemisphere with moderate surrounding vasogenic edema and 13 mm of leftward midline shift and subfalcine herniation. MRI of the brain with and without contrast is recommended for further characterization. 2. Moderate  noncommunicating hydrocephalus. Electronically Signed   By: Deatra Robinson M.D.   On: 05/12/2021 03:06    ____________________________________________   PROCEDURES and INTERVENTIONS  Procedure(s) performed (including Critical Care):  .1-3 Lead EKG Interpretation  Date/Time: 05/12/2021 3:49 AM Performed by: Delton Prairie, MD Authorized by: Delton Prairie, MD     Interpretation: normal     ECG rate:  80   ECG rate assessment: normal     Rhythm: sinus rhythm     Ectopy: none     Conduction: normal   .Critical Care  Date/Time: 05/12/2021 3:49 AM Performed by: Delton Prairie, MD Authorized by: Delton Prairie, MD   Critical care provider statement:    Critical care time (minutes):  45   Critical care was necessary to treat or prevent imminent or life-threatening deterioration of the following conditions:  CNS failure or compromise   Critical care was time spent personally by me on the following activities:  Discussions with consultants, evaluation of patient's response to treatment, examination of patient, ordering and performing treatments and interventions, ordering and review of laboratory studies, ordering and review of radiographic studies, pulse oximetry, re-evaluation of patient's condition, obtaining history from patient or surrogate and review of old charts  Medications  acetaminophen (TYLENOL) tablet 1,000 mg (1,000 mg Oral Given 05/12/21 0216)  ketorolac (TORADOL) 30 MG/ML injection 15 mg (15 mg Intravenous Given 05/12/21 0218)  droperidol (INAPSINE) 2.5 MG/ML injection 2.5 mg (2.5 mg Intravenous Given 05/12/21 0216)  lactated ringers bolus 1,000 mL (1,000 mLs Intravenous New Bag/Given 05/12/21 0222)  fentaNYL (  SUBLIMAZE) injection 50 mcg (50 mcg Intravenous Given 05/12/21 0333)  dexamethasone (DECADRON) injection 10 mg (10 mg Intravenous Given 05/12/21 0338)    ____________________________________________   MDM / ED COURSE   Otherwise healthy 40 year old male presents to the ED with 2  weeks of unremitting global headache, with evidence of new intracranial mass of uncertain etiology, requiring transfer for intracranial neurosurgical capabilities.  Normal vitals.  Exam without evidence of focal neurologic deficits.  He appears quite uncomfortable with photophobia and severe global headache/pain.  CT head obtained due to his normal symptoms and no history of significant headaches, and unexpectedly demonstrates a large right-sided intracranial mass with midline shift.  I discussed the case with neurosurgery, who recommends transfer to [facility] for intervention.  Provide steroids and no seizure prophylaxis, at the recommendations of neurosurgery.  We will transfer via ground to [facility] for further work-up and management of his new intracranial mass.  No seizures here in the ED blood work is pending.   Clinical Course as of 05/12/21 0352  Wed May 12, 2021  0309 I speak with Dr. Adriana Simas, who is pulling up Epic. Will review images and call me back [DS]  0315 10 decadron. Recommends transfer to [facility]. No sz ppx at this point.  [DS]  0317 I update pt of this, he reports understanding and agreement with plan of care [DS]  0327 Call from [facility] transfer center, they take pertinent info, will page it out [DS]  0340 Accepted to [facility], ED-to-ED, by Dr. Pearson Grippe [DS]    Clinical Course User Index [DS] Delton Prairie, MD    ____________________________________________   FINAL CLINICAL IMPRESSION(S) / ED DIAGNOSES  Final diagnoses:  Bad headache  Intracranial mass     ED Discharge Orders     None        Jahkeem Kurka Katrinka Blazing   Note:  This document was prepared using Dragon voice recognition software and may include unintentional dictation errors.    Delton Prairie, MD 05/12/21 [phone]

## 2021-05-12 NOTE — ED Notes (Signed)
[facility] to transport to [facility] ED 

## 2021-05-21 DIAGNOSIS — S60571A Other superficial bite of hand of right hand, initial encounter: Secondary | ICD-10-CM

## 2021-05-21 NOTE — ED Notes (Signed)
I have reviewed discharge instructions with the patient.  The patient verbalized understanding.

## 2021-05-21 NOTE — ED Notes (Signed)
Patient reports this evening he was trying to break up a fight between his mothers dog and his and got scrapped up in the process. Patient states that the dog is up to date on its shots and is going to be put down tomorrow, but states he is not sure where his mother lives so he is unable to tell us where the bite occurred. Patient states he had a right sided craniotomy on last Wednesday at [facility] and is worried about a secondary infection.

## 2021-05-21 NOTE — ED Provider Notes (Signed)
ED Provider Notes by Buelah ManisMusapatike, Tiffanye Hartmann S, MD at 05/21/21 2204                Author: Buelah ManisMusapatike, Dianely Krehbiel S, MD  Service: EMERGENCY  Author Type: Physician       Filed: 05/23/21 0112  Date of Service: 05/21/21 2204  Status: Signed          Editor: Lucely Leard, Florestine AversJosphat S, MD (Physician)            Procedure Orders        1. Other Procedure [[id number]][[id number]] ordered by Rayder Sullenger, Florestine AversJosphat S, MD                              EMERGENCY DEPARTMENT HISTORY AND PHYSICAL EXAM           Date: 05/21/2021   Patient Name: Eddie Spencer        History of Presenting Illness          Chief Complaint       Patient presents with        ?  Dog Bite           History Provided By: Patient      HPI: Eddie Spencer,  40 y.o. male with a significant past medical history of central craniotomy for brain mass  at [facility] presents to the ED with dog bite.  Patient reports that this evening he was trying to break up a fight between his mother's dog and he his and got scraped up in the process.  He has wound right hand and scratch left finger.  Patient reports dog  is to date on his shots.  Dogs were not rabid.  He does not remember tetanus status.      There are no other complaints, changes, or physical findings at this time.      PCP: Other, Phys, MD        No current facility-administered medications on file prior to encounter.          Current Outpatient Medications on File Prior to Encounter          Medication  Sig  Dispense  Refill           ?  levETIRAcetam (Keppra) 500 mg tablet  Take 1,000 mg by mouth two (2) times a day.         ?  sennosides (GERI-KOT PO)  Take  by mouth.               ?  acetaminophen (Tylenol Extra Strength) 500 mg tablet  Take 1,000 mg by mouth every six (6) hours as needed for Pain.                 Past History        Past Medical History:     Past Medical History:        Diagnosis  Date         ?  Brain mass             Past Surgical History:     Past Surgical History:         Procedure  Laterality  Date          ?  HX  CRANIOTOMY               Family History:   History reviewed. No pertinent family history.      Social History:  Social History          Tobacco Use         ?  Smoking status:  Never Smoker     ?  Smokeless tobacco:  Never Used       Substance Use Topics         ?  Alcohol use:  Not on file         ?  Drug use:  Not on file           Allergies:     Allergies        Allergen  Reactions         ?  Codeine  Unknown (comments)             Was told as a child he had a reaction                Review of Systems     Review of Systems    All other systems reviewed and are negative.           Physical Exam     Physical Exam   Vitals and nursing note reviewed.   Constitutional:        General: He is not in acute distress.     Appearance: He is well-developed. He is not diaphoretic.      Comments: Craniotomy wound right temporal appears intact, no signs of infection,  sutures are still intact.    HENT:       Head: Normocephalic and atraumatic. No right periorbital erythema or left periorbital erythema.      Jaw: No trismus.      Right Ear: External ear normal. No drainage or swelling. Tympanic membrane is not perforated, erythematous or bulging.       Left Ear: External ear normal. No drainage or swelling. Tympanic membrane is not perforated, erythematous or bulging.      Nose: Nose normal. No mucosal edema or rhinorrhea.      Right Sinus: No maxillary sinus tenderness or frontal sinus tenderness.       Left Sinus: No maxillary sinus tenderness or frontal sinus tenderness.      Mouth/Throat:      Mouth: No oral lesions.      Dentition: No dental abscesses.      Pharynx: Uvula  midline. No oropharyngeal exudate, posterior oropharyngeal erythema or uvula swelling.      Tonsils: No tonsillar abscesses.   Eyes:       General: No scleral icterus.        Right eye: No discharge.         Left eye: No discharge.      Conjunctiva/sclera: Conjunctivae normal.   Cardiovascular :       Rate and Rhythm: Normal rate and regular rhythm.       Heart sounds: Normal heart sounds. No murmur heard.   No friction rub. No gallop.     Pulmonary:       Effort: Pulmonary effort is normal. No tachypnea, accessory muscle usage or respiratory distress.      Breath sounds: Normal breath sounds . No decreased breath sounds, wheezing, rhonchi or rales.   Abdominal:      General: Bowel sounds are normal. There is no distension.       Palpations: Abdomen is soft.      Tenderness: There is no abdominal tenderness.     Musculoskeletal:          General:  No tenderness. Normal range of motion.      Cervical back: Normal range of motion and neck supple.     Lymphadenopathy:       Cervical: No cervical adenopathy.   Skin :      General: Skin is warm and dry.      Comments: There are multiple excoriations right hand between the first and second metacarpals.There is minimal bleeding.  On the left hand there  is an excoriation dorsal surface of thumb.  Both hands are neurovascularly intact with full range of motion of all fingers.   Neurological:       Mental Status: He is alert and oriented to person, place, and time.    Psychiatric:         Judgment: Judgment normal.               Lab and Diagnostic Study Results     Labs -    No results found for this or any previous visit (from the past 12 hour(s)).      Radiologic Studies -    @lastxrresult @     CT Results   (Last 48 hours)          None                 CXR Results   (Last 48 hours)          None                    Medical Decision Making and ED Course     - I am the first provider for this patient.  I reviewed the vital signs, available nursing notes, past medical history, past surgical history, family history and social history.      - Initial assessment performed. The patients presenting problems have been discussed, and they are in agreement with the care plan formulated and outlined with them.  I have encouraged them to ask questions as they arise throughout their visit.      Vital Signs-Reviewed the patient's vital  signs.   No data found.      Differential Diagnosis & Medical Decision Making Provider Note:             ED Course:             Procedures     Performed by: , MD   Other Procedure      Date/Time: 05/23/2021 1:11 AM   Performed by:  05/25/2021, MD   Authorized by:  Buelah Manis, MD       Consent:      Consent obtained:  Verbal     Consent given by:  Patient     Risks discussed:  Bleeding, infection and pain     Alternatives discussed:  Alternative treatment   Indications:      Indications:  Dog bite right hand   Anesthesia (see MAR for exact dosages):      Anesthesia method:  None   Post-procedure details:      Patient tolerance of procedure:  Tolerated well, no immediate complications   Comments:       Right hand dog bite wound cleaned with normal saline.  Band-Aid applied over the wound.               Disposition     Disposition: Condition stable   DC- Adult Discharges: All of the diagnostic tests were reviewed and questions answered. Diagnosis,  care plan and treatment options were discussed.  The patient understands the instructions and will follow up as directed. The patients results have been  reviewed with them.  They have been counseled regarding their diagnosis.  The patient verbally convey understanding and agreement of the signs, symptoms, diagnosis, treatment and prognosis and additionally agrees to follow up as recommended with their  PCP in 24 - 48 hours.  They also agree with the care-plan and convey that all of their questions have been answered.  I have also put together some discharge instructions for them that include: 1) educational information regarding their diagnosis, 2)  how to care for their diagnosis at home, as well a 3) list of reasons why they would want to return to the ED prior to their follow-up appointment, should their condition change.   Diagnosis:       1.  Dog bite, initial encounter               Disposition:         Follow-up Information                Follow up With  Specialties  Details  Why  Contact Info              [facility]    In 3 days  For wound re-check  [address]   [place] [address]   [phone]              [facility] EMERGENCY DEPT  Emergency Medicine  In 2 days    [address]   [place] [address]   [phone]                  Discharge Medication List as of 05/21/2021  9:49 PM              START taking these medications          Details        amoxicillin-clavulanate (Augmentin) 875-125 mg per tablet  Take 1 Tablet by mouth two (2) times a day., Normal, Disp-14 Tablet, R-0                     CONTINUE these medications which have NOT CHANGED          Details        levETIRAcetam (Keppra) 500 mg tablet  Take 1,000 mg by mouth two (2) times a day., Historical Med               sennosides (GERI-KOT PO)  Take  by mouth., Historical Med               acetaminophen (Tylenol Extra Strength) 500 mg tablet  Take 1,000 mg by mouth every six (6) hours as needed for Pain., Historical Med                         DISCHARGE PLAN:   1. Cannot display discharge medications since this patient is not currently admitted.      2.      Follow-up Information               Follow up With  Specialties  Details  Why  Contact Info              [facility]    In 3 days  For wound re-check  [address]   [place] [address]  [phone]              [facility] EMERGENCY DEPT  Emergency Medicine  In 2 days    [address]   [place] [address]   [phone]             3.  Return to ED if worse    4.      Discharge Medication List as of 05/21/2021  9:49 PM              START taking these medications          Details        amoxicillin-clavulanate (Augmentin) 875-125 mg per tablet  Take 1 Tablet by mouth two (2) times a day., Normal, Disp-14 Tablet, R-0                     CONTINUE these medications which have NOT CHANGED          Details        levETIRAcetam (Keppra) 500 mg tablet  Take 1,000 mg by mouth two (2) times a day.,  Historical Med               sennosides (GERI-KOT PO)  Take  by mouth., Historical Med               acetaminophen (Tylenol Extra Strength) 500 mg tablet  Take 1,000 mg by mouth every six (6) hours as needed for Pain., Historical Med                       Remove if admitted/transferred        Diagnosis/Clinical Impression        Clinical Impression:       1.  Dog bite, initial encounter            Attestations: Marek Nghiem S Analese Sovine, MD      Please note that this dictation was completed with Dragon, the computer voice recognition software.  Quite often unanticipated grammatical, syntax, homophones, and other interpretive errors are inadvertently  transcribed by the computer software.  Please disregard these errors.  Please excuse any errors that have escaped final proofreading.  Thank you.

## 2021-05-22 ENCOUNTER — Inpatient Hospital Stay
Admit: 2021-05-22 | Discharge: 2021-05-22 | Disposition: A | Discharge status: Home / Self Care | Payer: PRIVATE HEALTH INSURANCE | Attending: Emergency Medicine

## 2021-05-22 MED ORDER — DIPHTH,PERTUS(AC)TETANUS VAC(PF) 2.5 LF UNIT-8 MCG-5 LF/0.5 ML INJ
INTRAMUSCULAR | Status: AC
Start: 2021-05-22 — End: 2021-05-21
  Administered 2021-05-22: 02:00:00 via INTRAMUSCULAR

## 2021-05-22 MED ORDER — AMOXICILLIN CLAVULANATE 875 MG-125 MG TAB
875-125 mg | ORAL | Status: AC
Start: 2021-05-22 — End: 2021-05-21
  Administered 2021-05-22: 02:00:00 via ORAL

## 2021-05-22 MED ORDER — AMOXICILLIN CLAVULANATE 875 MG-125 MG TAB
875-125 mg | ORAL_TABLET | Freq: Two times a day (BID) | ORAL | 0 refills | Status: AC
Start: 2021-05-22 — End: ?

## 2021-05-22 MED FILL — AMOXICILLIN CLAVULANATE 875 MG-125 MG TAB: 875-125 mg | ORAL | Qty: 1

## 2021-05-22 MED FILL — BOOSTRIX TDAP 2.5 LF UNIT-8 MCG-5 LF/0.5 ML INTRAMUSCULAR SYRINGE: INTRAMUSCULAR | Qty: 0.5

## 2022-03-31 IMAGING — CR DG ELBOW COMPLETE 3+V*L*
4 series · 4 of 4 positions shown · non-contrast
Comparison: None.

CLINICAL DATA: Fell.  Left elbow pain.

EXAM:
LEFT ELBOW - COMPLETE 3+ VIEW

[elbow ap]
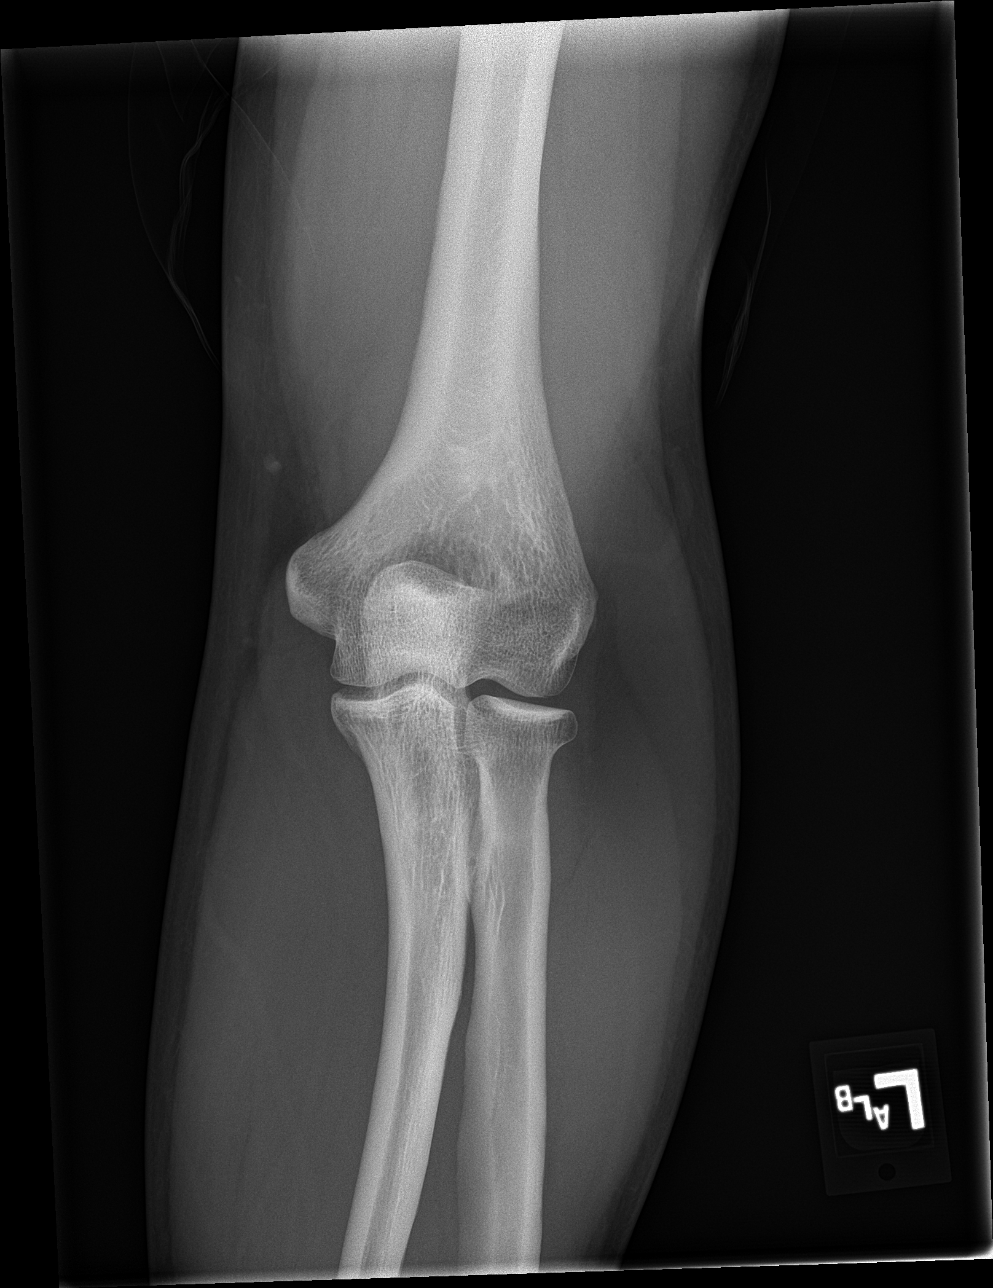

[elbow obl (1 of 2)]
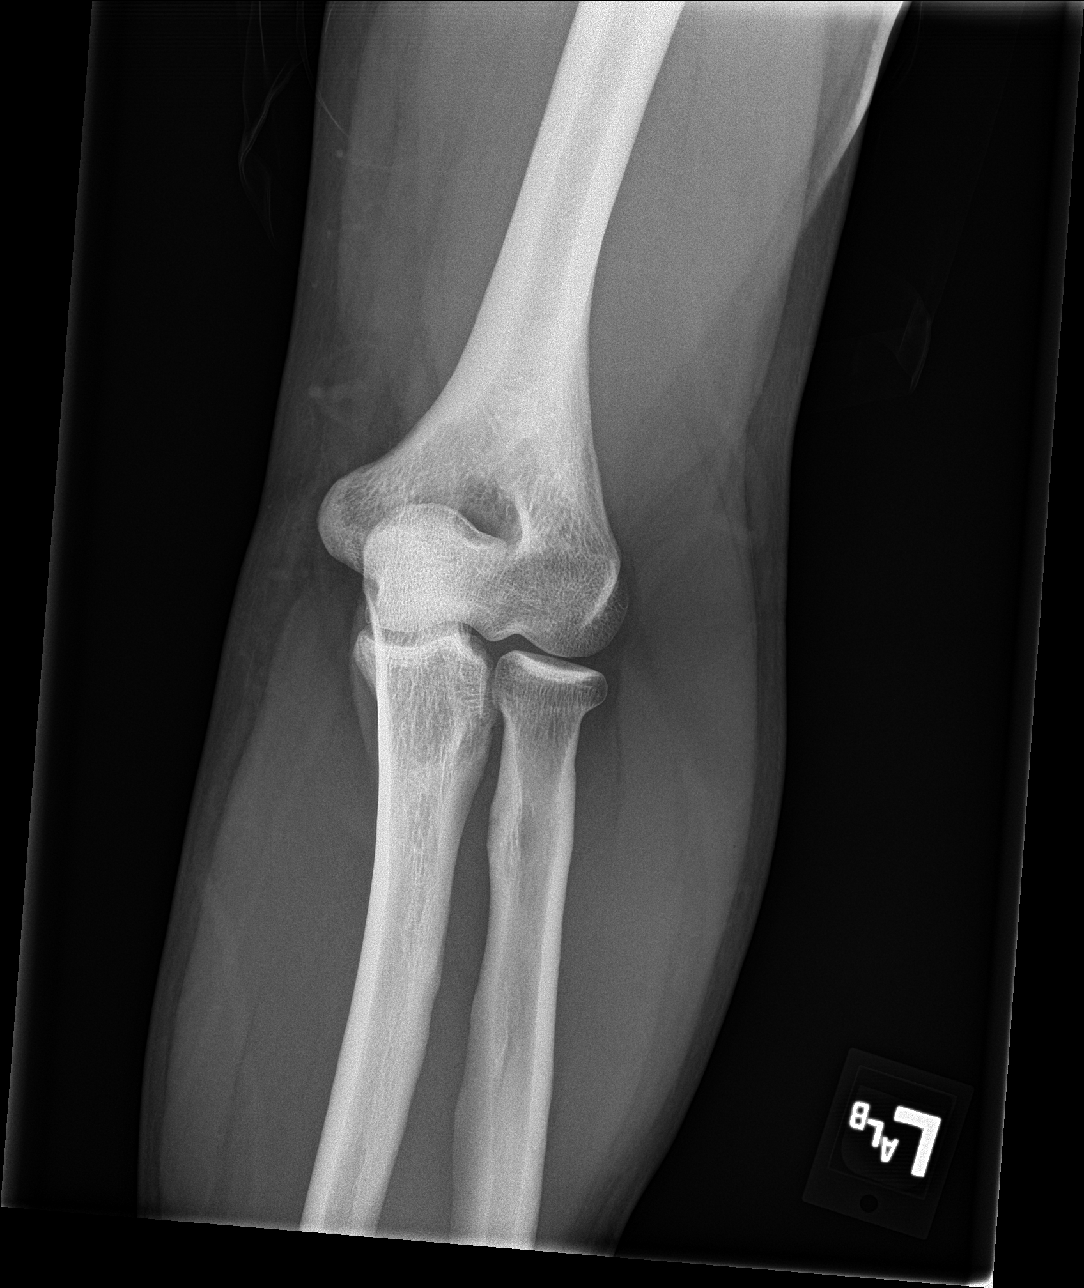

[elbow obl (2 of 2)]
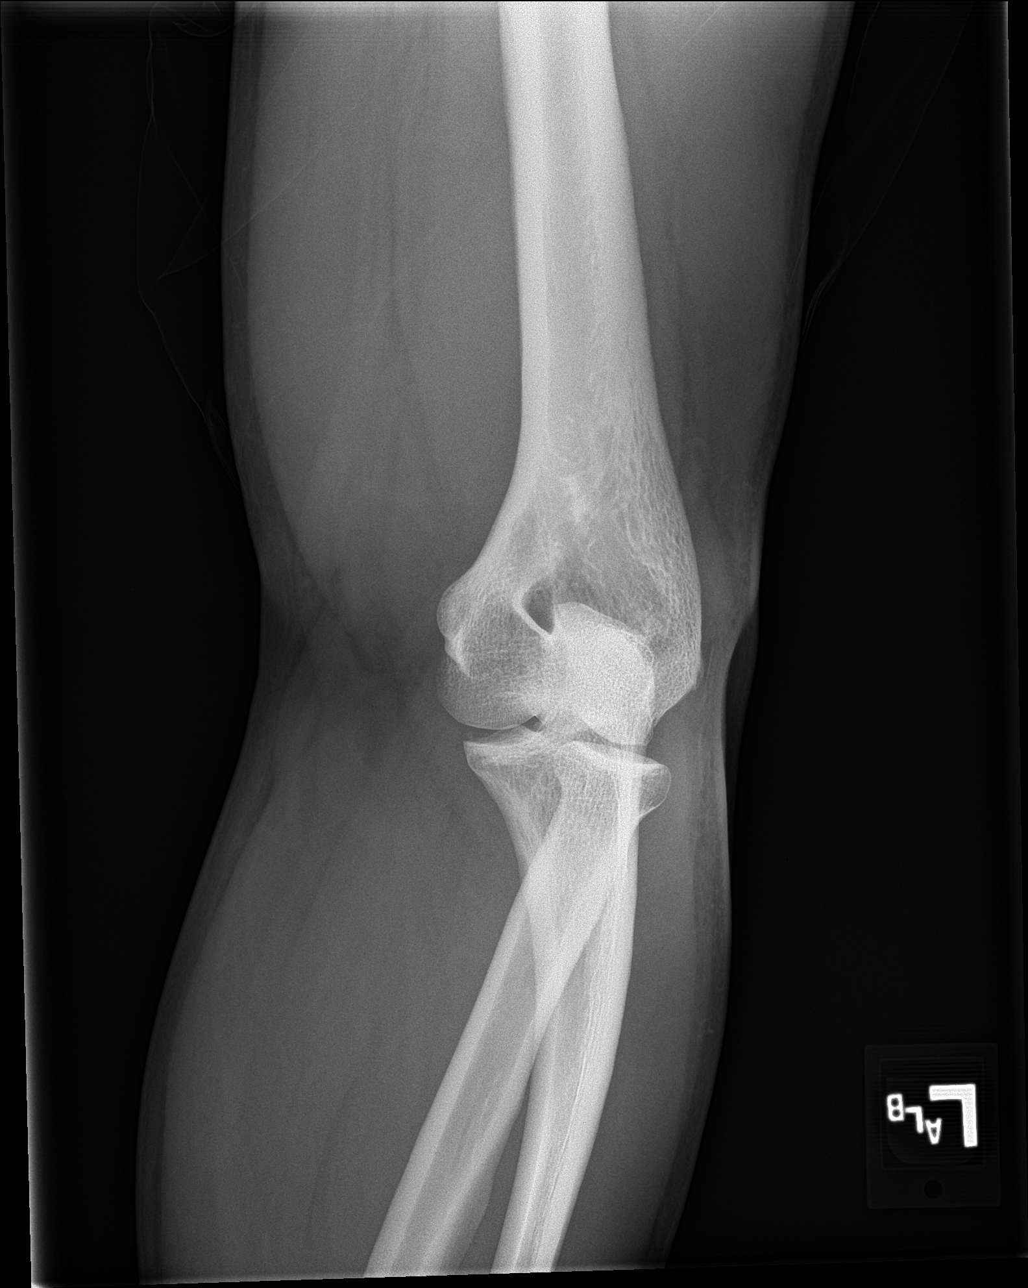

[elbow lat]
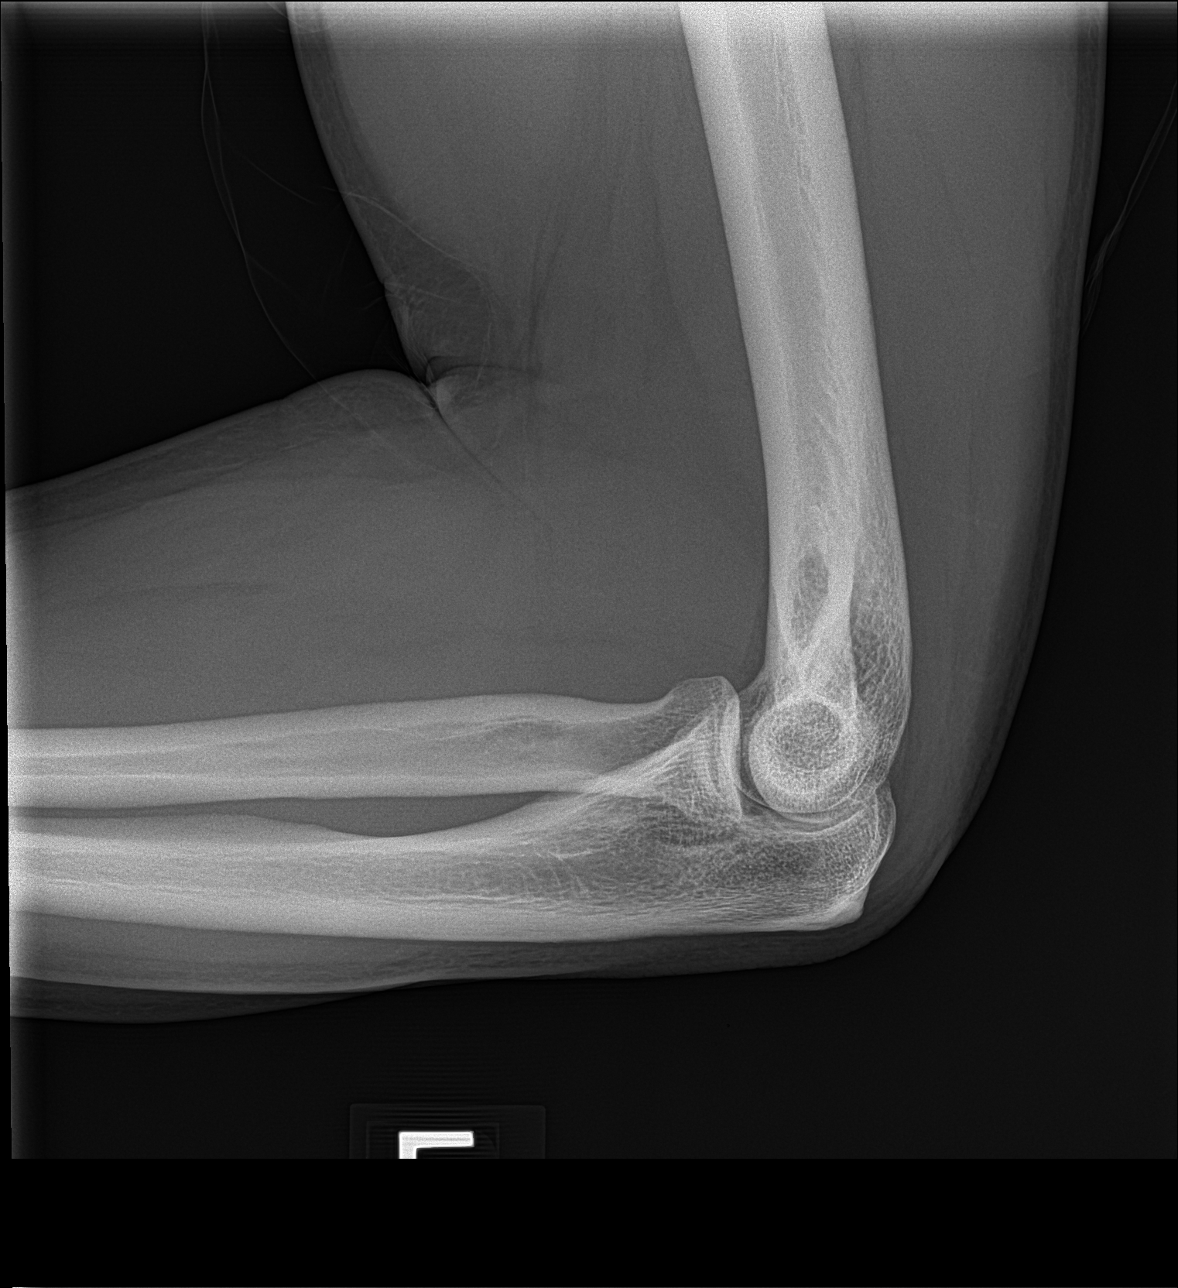

[4 of 4 positions shown; findings below may reference images not displayed]

FINDINGS: The joint spaces are maintained. No acute fracture. No osteochondral
lesion. No joint effusion.
IMPRESSION: No acute bony findings.
